# Patient Record
Sex: Female | Born: 2005 | Race: Black or African American | Hispanic: No | Marital: Single | Smoking: Never smoker
Health system: Southern US, Community
[De-identification: ages and names within clinical notes are randomized; demographics above are authoritative.]

---
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
## 2023-04-13 ENCOUNTER — Telehealth: Payer: Self-pay

## 2023-04-13 ENCOUNTER — Ambulatory Visit
Admission: EM | Admit: 2023-04-13 | Discharge: 2023-04-13 | Disposition: A | Discharge status: Home / Self Care | Payer: Medicaid Other | Attending: Emergency Medicine | Admitting: Emergency Medicine

## 2023-04-13 DIAGNOSIS — N760 Acute vaginitis: Secondary | ICD-10-CM | POA: Diagnosis not present

## 2023-04-13 DIAGNOSIS — R1030 Lower abdominal pain, unspecified: Secondary | ICD-10-CM | POA: Insufficient documentation

## 2023-04-13 DIAGNOSIS — B9689 Other specified bacterial agents as the cause of diseases classified elsewhere: Secondary | ICD-10-CM | POA: Diagnosis present

## 2023-04-13 DIAGNOSIS — R519 Headache, unspecified: Secondary | ICD-10-CM | POA: Insufficient documentation

## 2023-04-13 DIAGNOSIS — B3731 Acute candidiasis of vulva and vagina: Secondary | ICD-10-CM | POA: Insufficient documentation

## 2023-04-13 DIAGNOSIS — Z20822 Contact with and (suspected) exposure to covid-19: Secondary | ICD-10-CM | POA: Insufficient documentation

## 2023-04-13 LAB — RESP PANEL BY RT-PCR (RSV, FLU A&B, COVID)  RVPGX2
Influenza A by PCR: NEGATIVE
Influenza B by PCR: NEGATIVE
Resp Syncytial Virus by PCR: NEGATIVE
SARS Coronavirus 2 by RT PCR: NEGATIVE

## 2023-04-13 LAB — WET PREP, GENITAL
Sperm: NONE SEEN
Trich, Wet Prep: NONE SEEN
WBC, Wet Prep HPF POC: >=10 — AB (ref <10)

## 2023-04-13 LAB — URINALYSIS, ROUTINE W REFLEX MICROSCOPIC
Bilirubin Urine: NEGATIVE
Glucose, UA: NEGATIVE mg/dL
Hgb urine dipstick: NEGATIVE
Leukocytes,Ua: NEGATIVE
Nitrite: NEGATIVE
Protein, ur: NEGATIVE mg/dL
Specific Gravity, Urine: >1.03 — ABNORMAL HIGH (ref 1.005–1.030)
pH: 6 (ref 5.0–8.0)

## 2023-04-13 LAB — PREGNANCY, URINE: Preg Test, Ur: NEGATIVE

## 2023-04-13 MED ORDER — METRONIDAZOLE 500 MG PO TABS: 500.0000 mg | ORAL_TABLET | Freq: Two times a day (BID) | ORAL | 0 refills | Status: AC

## 2023-04-13 MED ORDER — NAPROXEN 500 MG PO TABS: 500.0000 mg | ORAL_TABLET | Freq: Two times a day (BID) | ORAL | 0 refills | Status: AC

## 2023-04-13 MED ORDER — ONDANSETRON 8 MG PO TBDP
ORAL_TABLET | ORAL | 0 refills | Status: AC
Start: 2023-04-13 — End: ?

## 2023-04-13 MED ORDER — FLUCONAZOLE 150 MG PO TABS: 150.0000 mg | ORAL_TABLET | Freq: Once | ORAL | 1 refills | Status: AC

## 2023-04-13 NOTE — ED Triage Notes (Signed)
Pt c/o HA,nausea & dizziness x2 days. Denies any hx of migraines.

## 2023-04-13 NOTE — Telephone Encounter (Signed)
Called pt in regards to lab results. Lvm for pt to return call.   Per Dr.Mortenson: Yeast, BV positive. I have already e-prescribed Flagyl and Diflucan to the pharmacy on record for her.

## 2023-04-13 NOTE — ED Provider Notes (Signed)
HPI  SUBJECTIVE:  Julie Cuevas is a 18 y.o. female who reports 2 days of intermittent, gradual onset frontal headaches described as "tension" that lasts hours accompanied with nausea, dizziness described as feeling off balance/lightheadedness.  She reports phonophobia, photophobia.  No fevers, visual changes, ear pain, nasal congestion, rhinorrhea, sinus pain or pressure, dental pain jaw pain, numbness/tingling/weakness in her face/arm/leg, slurred speech, discoordination, neck stiffness, rash, syncope, seizures.  It did not occur with exertion.  No body aches, sore throat, cough, wheeze, shortness of breath.  No known flu or COVID exposure.  She got the COVID and flu vaccines.  She has had headaches like this before when she gets sick with influenza.  She has tried rest with improvement in her symptoms.  Symptoms are worse with light/noise.  She also reports low midline crampy, constant abdominal pain accompanied with a vaginal odor starting today.  It does not migrate, radiate.  No urinary complaints, vaginal bleeding, discharge.  She has been a long-term monogamous relationship with a female, who is asymptomatic.  STDs are not a concern today.  She is having mild constipation.  Last bowel movement was 2 days ago.  No abdominal distention.  States that the car ride over here was not painful and is able to walk, stand up straight without problems.  She has not tried anything for abdominal pain.  No aggravating or alleviating factors.  She has had similar abdominal pain with the onset of menses.  Past medical history negative for STDs, BV, yeast, UTI, diabetes, migraines, abdominal surgeries, atrial fibrillation, CVA, aneurysm.  Family history negative for aneurysm.  LMP: A few months ago.  She has a Nexplanon.  PCP: [facility].   History reviewed. No pertinent past medical history.  History reviewed. No pertinent surgical history.  History reviewed. No pertinent family history.  Social  History   Tobacco Use   Smoking status: Never   Smokeless tobacco: Never  Substance Use Topics   Alcohol use: No    No current facility-administered medications for this encounter.  Current Outpatient Medications:    Cholecalciferol (VITAMIN D3) 50 MCG (2000 UT) capsule, Take 2,000 Units by mouth daily., Disp: , Rfl:    escitalopram (LEXAPRO) 10 MG tablet, Take 15 mg by mouth daily., Disp: , Rfl:    fluconazole (DIFLUCAN) 150 MG tablet, Take 1 tablet (150 mg total) by mouth once for 1 dose. 1 tab po x 1. May repeat in 72 hours if no improvement, Disp: 2 tablet, Rfl: 1   hydrOXYzine (ATARAX) 10 MG tablet, Take 10 mg by mouth at bedtime., Disp: , Rfl:    metroNIDAZOLE (FLAGYL) 500 MG tablet, Take 1 tablet (500 mg total) by mouth 2 (two) times daily for 7 days., Disp: 14 tablet, Rfl: 0   naproxen (NAPROSYN) 500 MG tablet, Take 1 tablet (500 mg total) by mouth 2 (two) times daily., Disp: 20 tablet, Rfl: 0   ondansetron (ZOFRAN-ODT) 8 MG disintegrating tablet, 1/2- 1 tablet q 8 hr prn nausea, vomiting, Disp: 20 tablet, Rfl: 0   prazosin (MINIPRESS) 1 MG capsule, Take 1 mg by mouth at bedtime., Disp: , Rfl:    traMADol (ULTRAM) 50 MG tablet, Take 50 mg by mouth every 6 (six) hours as needed., Disp: , Rfl:    ARIPiprazole (ABILIFY) 2 MG tablet, Take 2 mg by mouth daily., Disp: , Rfl:    busPIRone (BUSPAR) 7.5 MG tablet, Take by mouth., Disp: , Rfl:    etonogestrel (NEXPLANON) 68 MG IMPL  implant, Inject 1 each into the skin once., Disp: , Rfl:   Allergies  Allergen Reactions   Magnolia Hives     ROS  As noted in HPI.   Physical Exam  BP 122/76 (BP Location: Left Arm)   Pulse 73   Temp 98.7 F (37.1 C) (Oral)   Resp 16   Ht 5\' 6"  (1.676 m)   Wt 88 kg   SpO2 100%   BMI 31.31 kg/m   Constitutional: Well developed, well nourished, no acute distress Eyes: PERRL, EOMI, conjunctiva normal bilaterally.  HENT: Normocephalic, atraumatic,mucus membranes moist, normal dentition.  TM  normal b/l. No TMJ tenderness. No nasal congestion, no sinus tenderness. No temporal artery tenderness.   Neck: no cervical LN.  No trapezial muscle tenderness. No meningismus Respiratory: normal inspiratory effort Cardiovascular: Normal rate, regular rhythm GI:  nondistended, soft, nontender, no guarding, rebound. Back: No CVAT skin: No rash, skin intact Musculoskeletal: No edema, no tenderness, no deformities Neurologic: Alert & oriented x 3, CN III-XII intact, romberg neg, finger-> nose, heel-> shin equal b/l, Romberg neg, tandem gait steady Psychiatric: Speech and behavior appropriate   ED Course   Medications - No data to display  Orders Placed This Encounter  Procedures   Wet prep, genital    Standing Status:   Standing    Number of Occurrences:   1   Resp panel by RT-PCR (RSV, Flu A&B, Covid) Anterior Nasal Swab    Standing Status:   Standing    Number of Occurrences:   1   Urinalysis, Routine w reflex microscopic -Urine, Clean Catch    Standing Status:   Standing    Number of Occurrences:   1    Specimen Source:   Urine, Clean Catch [76]   Pregnancy, urine    Standing Status:   Standing    Number of Occurrences:   1   Airborne and Contact precautions    Standing Status:   Standing    Number of Occurrences:   1   Results for orders placed or performed during the hospital encounter of 04/13/23 (from the past 24 hours)  Urinalysis, Routine w reflex microscopic -Urine, Clean Catch     Status: Abnormal   Collection Time: 04/13/23 12:31 PM  Result Value Ref Range   Color, Urine YELLOW YELLOW   APPearance CLEAR CLEAR   Specific Gravity, Urine >1.030 (H) 1.005 - 1.030   pH 6.0 5.0 - 8.0   Glucose, UA NEGATIVE NEGATIVE mg/dL   Hgb urine dipstick NEGATIVE NEGATIVE   Bilirubin Urine NEGATIVE NEGATIVE   Ketones, ur TRACE (A) NEGATIVE mg/dL   Protein, ur NEGATIVE NEGATIVE mg/dL   Nitrite NEGATIVE NEGATIVE   Leukocytes,Ua NEGATIVE NEGATIVE  Pregnancy, urine     Status:  None   Collection Time: 04/13/23 12:31 PM  Result Value Ref Range   Preg Test, Ur NEGATIVE NEGATIVE  Wet prep, genital     Status: Abnormal   Collection Time: 04/13/23 12:31 PM   Specimen: Urine, Clean Catch  Result Value Ref Range   Yeast Wet Prep HPF POC PRESENT (A) NONE SEEN   Trich, Wet Prep NONE SEEN NONE SEEN   Clue Cells Wet Prep HPF POC PRESENT (A) NONE SEEN   WBC, Wet Prep HPF POC >=10 (A) <10   Sperm NONE SEEN    No results found.   ED Clinical Impression  1. Acute nonintractable headache, unspecified headache type   2. Encounter for laboratory testing for COVID-19 virus  3. Lower abdominal pain   4. BV (bacterial vaginosis)   5. Vaginal yeast infection     ED Assessment/Plan      1.  Headache.  Patient has no focal neurologic deficits.  She states she had similar headache before with influenza, so we will check a flu and COVID.  Tension headache also high in the differential.  no sudden onset. Doubt SAH, ICH or space occupying lesion. Pt without fevers/chills, Pt has no meningeal sx, no nuchal rigidity. Doubt meningitis. Pt with normal neuro exam, no evidence of CVA/TIA.  Pt BP not elevated significantly, doubt hypertensive emergency. No evidence of temporal artery tenderness, no evidence of glaucoma or other ocular pathology.   COVID, flu negative. Will d/c home with ibuprofen Tylenol, Zofran,  and have pt F/U with PCP.   2.  Abdominal pain/vaginal odor.  Will check a UA, wet prep, urine pregnancy.   Pt abd exam is benign, no peritoneal signs. No evidence of surgical abd. Doubt SBO, mesenteric ischemia, appendicitis, hepatitis, cholecystitis, pancreatitis, or perforated viscus. No evidence to support or suggest GYN pathology such as PID, ovarian torsion or infection.  Urine pregnancy negative.  No UTI.   Wet prep positive for BV and yeast.  Will send home with Flagyl and Diflucan.  Will contact patient at [phone] with any abnormal tests.  Note for  several days.  Discussed labs, MDM, plan for follow up, signs and sx that should prompt return to ER. Pt agrees with plan  Meds ordered this encounter  Medications   naproxen (NAPROSYN) 500 MG tablet    Sig: Take 1 tablet (500 mg total) by mouth 2 (two) times daily.    Dispense:  20 tablet    Refill:  0   ondansetron (ZOFRAN-ODT) 8 MG disintegrating tablet    Sig: 1/2- 1 tablet q 8 hr prn nausea, vomiting    Dispense:  20 tablet    Refill:  0   metroNIDAZOLE (FLAGYL) 500 MG tablet    Sig: Take 1 tablet (500 mg total) by mouth 2 (two) times daily for 7 days.    Dispense:  14 tablet    Refill:  0   fluconazole (DIFLUCAN) 150 MG tablet    Sig: Take 1 tablet (150 mg total) by mouth once for 1 dose. 1 tab po x 1. May repeat in 72 hours if no improvement    Dispense:  2 tablet    Refill:  1    *This clinic note was created using Scientist, clinical (histocompatibility and immunogenetics). Therefore, there may be occasional mistakes despite careful proofreading.  ?    Domenick Gong, MD 04/15/23 [phone]

## 2023-04-13 NOTE — Discharge Instructions (Addendum)
urine pregnancy negative.  Urinalysis negative for UTI, but it shows that you need to drink more fluids. wet prep, COVID and flu pending.  We will contact you if any of these come back abnormal and we need to change management.  In the meantime, continue pushing electrolyte containing fluids, take Naprosyn combined with Tylenol twice a day, Zofran as needed for nausea.  Follow-up with your primary care provider if not getting any better in several days.  Go to the ER if you get worse.

## 2023-04-13 NOTE — Telephone Encounter (Signed)
Received call from pt in regards to + wet prep results. Informed pt that diflucan & flagyl have been sent to [facility] in [place]. Pt verbalized udnerstanding to info.

## 2023-07-26 ENCOUNTER — Ambulatory Visit
Admission: EM | Admit: 2023-07-26 | Discharge: 2023-07-26 | Disposition: A | Discharge status: Home / Self Care | Attending: Family Medicine | Admitting: Family Medicine

## 2023-07-26 DIAGNOSIS — R42 Dizziness and giddiness: Secondary | ICD-10-CM

## 2023-07-26 DIAGNOSIS — R110 Nausea: Secondary | ICD-10-CM | POA: Diagnosis not present

## 2023-07-26 DIAGNOSIS — E860 Dehydration: Secondary | ICD-10-CM

## 2023-07-26 MED ORDER — ONDANSETRON HCL 4 MG PO TABS: 4.0000 mg | ORAL_TABLET | Freq: Three times a day (TID) | ORAL | 0 refills | Status: AC | PRN
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
## 2023-07-26 NOTE — Discharge Instructions (Addendum)
 Take the Zofran  every 8 hours as needed for nausea or vomiting.  Work on increasing your oral fluid intake to a goal of 64 ounces of water daily to help improve hydration and see if this helps with your nausea and your intermittent lightheadedness.  If develop any new or worsening symptoms other return for reevaluation or follow-up with your primary care provider.

## 2023-07-26 NOTE — ED Triage Notes (Addendum)
 Patient presents to UC for dizziness, nausea since this morning. Reports she has been anxious. Takes Lexapro for anxiety. States feeling overwhelmed with lots of different changes in her life r/t work, school and family. States she sees therapy.  No current thoughts of SI today.

## 2023-08-19 ENCOUNTER — Ambulatory Visit: Payer: Self-pay

## 2023-08-28 ENCOUNTER — Ambulatory Visit
Admission: EM | Admit: 2023-08-28 | Discharge: 2023-08-28 | Disposition: A | Discharge status: Home / Self Care | Attending: Family Medicine | Admitting: Family Medicine

## 2023-08-28 DIAGNOSIS — B349 Viral infection, unspecified: Secondary | ICD-10-CM | POA: Insufficient documentation

## 2023-08-28 LAB — RESP PANEL BY RT-PCR (FLU A&B, COVID) ARPGX2
Influenza A by PCR: NEGATIVE
Influenza B by PCR: NEGATIVE
SARS Coronavirus 2 by RT PCR: NEGATIVE

## 2023-08-28 LAB — GROUP A STREP BY PCR: Group A Strep by PCR: NOT DETECTED

## 2023-08-28 NOTE — ED Triage Notes (Signed)
 Pt c/o sore throat,nausea & dizziness x1 day. Has tried dayquil w/o relief.

## 2023-08-28 NOTE — Discharge Instructions (Addendum)
 You have tested negative for COVID, flu, and strep throat. Please treat your symptoms with over the counter cough medication, tylenol or ibuprofen, humidifier, and rest. Viral illnesses can last 7-14 days. Please follow up with your PCP if your symptoms are not improving. Please go to the ER for any worsening symptoms. This includes but is not limited to fever you can not control with tylenol or ibuprofen, you are not able to stay hydrated, you have shortness of breath or chest pain.  Thank you for choosing [facility] for your healthcare needs. I hope you feel better soon!
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
## 2023-12-27 ENCOUNTER — Ambulatory Visit
Admission: EM | Admit: 2023-12-27 | Discharge: 2023-12-27 | Disposition: A | Discharge status: Home / Self Care | Attending: Family Medicine | Admitting: Family Medicine

## 2023-12-27 DIAGNOSIS — J069 Acute upper respiratory infection, unspecified: Secondary | ICD-10-CM | POA: Diagnosis present

## 2023-12-27 LAB — RESP PANEL BY RT-PCR (FLU A&B, COVID) ARPGX2
Influenza A by PCR: NEGATIVE
Influenza B by PCR: NEGATIVE
SARS Coronavirus 2 by RT PCR: NEGATIVE

## 2023-12-27 LAB — GROUP A STREP BY PCR: Group A Strep by PCR: NOT DETECTED

## 2023-12-27 MED ORDER — IPRATROPIUM BROMIDE 0.06 % NA SOLN: 2.0000 | Freq: Four times a day (QID) | NASAL | 0 refills | Status: AC

## 2023-12-27 NOTE — ED Triage Notes (Signed)
 Pt c/o HA,sore throat & congestion x2 days. Has tried OTC meds w/o relief.
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
## 2023-12-27 NOTE — Discharge Instructions (Addendum)
 Your strep test is negative. . You have a viral respiratory infection that will gradually improve over the next 7-10 days. Cough may last up to 3 weeks.  We will contact you if your COVID or influenza tests are positive.     If your were prescribed medication, stop by the pharmacy to pick them up.   You can take Tylenol  and/or Ibuprofen as needed for fever reduction and pain relief.    For cough: honey 1/2 to 1 teaspoon (you can dilute the honey in water or another fluid). You can also use guaifenesin and dextromethorphan for cough.  You can use a humidifier for chest congestion and cough.  If you don't have a humidifier, you can sit in the bathroom with the hot shower running.      For sore throat: try warm salt water gargles, Mucinex sore throat cough drops or cepacol lozenges, throat spray, warm tea or water with lemon/honey, popsicles or ice, or OTC cold relief medicine for throat discomfort. You can also purchase chloraseptic spray at the pharmacy or dollar store.    For congestion: take a daily anti-histamine like Zyrtec, Claritin, and a oral decongestant, such as pseudoephedrine.  You can also use Flonase 1-2 sprays in each nostril daily. Afrin is also a good option, if you do not have high blood pressure. Pick up the prescription nasal spray from the pharmacy.    It is important to stay hydrated: drink plenty of fluids (water, gatorade/powerade/pedialyte, juices, or teas) to keep your throat moisturized and help further relieve irritation/discomfort.    Return or go to the Emergency Department if symptoms worsen or do not improve in the next few days

## 2023-12-28 ENCOUNTER — Ambulatory Visit: Payer: Self-pay | Admitting: Family Medicine

## 2024-03-24 ENCOUNTER — Ambulatory Visit: Payer: Self-pay

## 2024-03-27 ENCOUNTER — Ambulatory Visit
Admission: EM | Admit: 2024-03-27 | Discharge: 2024-03-27 | Disposition: A | Discharge status: Home / Self Care | Attending: Physician Assistant | Admitting: Physician Assistant

## 2024-03-27 DIAGNOSIS — J069 Acute upper respiratory infection, unspecified: Secondary | ICD-10-CM | POA: Diagnosis not present

## 2024-03-27 DIAGNOSIS — R051 Acute cough: Secondary | ICD-10-CM

## 2024-03-27 DIAGNOSIS — J029 Acute pharyngitis, unspecified: Secondary | ICD-10-CM | POA: Diagnosis not present

## 2024-03-27 DIAGNOSIS — R490 Dysphonia: Secondary | ICD-10-CM

## 2024-03-27 LAB — POC SOFIA SARS ANTIGEN FIA: SARS Coronavirus 2 Ag: NEGATIVE

## 2024-03-27 LAB — POCT RAPID STREP A (OFFICE): Rapid Strep A Screen: NEGATIVE

## 2024-03-27 MED ORDER — LIDOCAINE VISCOUS HCL 2 % MT SOLN: 15.0000 mL | OROMUCOSAL | 0 refills | Status: AC | PRN

## 2024-03-27 MED ORDER — PROMETHAZINE-DM 6.25-15 MG/5ML PO SYRP: 5.0000 mL | ORAL_SOLUTION | Freq: Four times a day (QID) | ORAL | 0 refills | Status: AC | PRN

## 2024-03-27 NOTE — Discharge Instructions (Signed)
-
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
## 2024-03-27 NOTE — ED Triage Notes (Signed)
 Pt present sore throat with nasal and chest congestion, symptoms started three days ago. Pt state took otc medication with no relief.
